# Patient Record
Sex: Male | Born: 1985 | Race: Black or African American | Hispanic: No | Marital: Single
Health system: Southern US, Community
[De-identification: ages and names within clinical notes are randomized; demographics above are authoritative.]

---
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
## 2016-12-31 ENCOUNTER — Encounter: Payer: Self-pay | Admitting: Emergency Medicine

## 2016-12-31 ENCOUNTER — Emergency Department
Admission: EM | Admit: 2016-12-31 | Discharge: 2016-12-31 | Disposition: A | Discharge status: Home / Self Care | Payer: No Typology Code available for payment source | Attending: Emergency Medicine | Admitting: Emergency Medicine

## 2016-12-31 DIAGNOSIS — M791 Myalgia: Secondary | ICD-10-CM | POA: Diagnosis not present

## 2016-12-31 DIAGNOSIS — M25511 Pain in right shoulder: Secondary | ICD-10-CM | POA: Diagnosis present

## 2016-12-31 DIAGNOSIS — M7918 Myalgia, other site: Secondary | ICD-10-CM

## 2016-12-31 MED ORDER — IBUPROFEN 800 MG PO TABS: 800.0000 mg | ORAL_TABLET | Freq: Three times a day (TID) | ORAL | 0 refills | Status: AC | PRN

## 2016-12-31 MED ORDER — METHOCARBAMOL 750 MG PO TABS: 750.0000 mg | ORAL_TABLET | Freq: Four times a day (QID) | ORAL | 0 refills | Status: AC
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
## 2016-12-31 NOTE — Discharge Instructions (Signed)
Take ibuprofen and robaxin, use wet heat, stretches, then ice, follow-up with [facility], if worsening return to the ED
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
## 2017-01-07 ENCOUNTER — Ambulatory Visit
Admission: RE | Admit: 2017-01-07 | Discharge: 2017-01-07 | Disposition: A | Discharge status: Home / Self Care | Payer: No Typology Code available for payment source | Source: Ambulatory Visit | Attending: Chiropractor | Admitting: Chiropractor

## 2017-01-07 ENCOUNTER — Other Ambulatory Visit: Payer: Self-pay | Admitting: Chiropractor

## 2017-01-07 DIAGNOSIS — R52 Pain, unspecified: Secondary | ICD-10-CM | POA: Insufficient documentation

## 2020-08-20 ENCOUNTER — Emergency Department: Payer: No Typology Code available for payment source

## 2020-08-20 ENCOUNTER — Other Ambulatory Visit: Payer: Self-pay

## 2020-08-20 ENCOUNTER — Encounter: Payer: Self-pay | Admitting: Emergency Medicine

## 2020-08-20 ENCOUNTER — Emergency Department
Admission: EM | Admit: 2020-08-20 | Discharge: 2020-08-21 | Disposition: A | Discharge status: Home / Self Care | Payer: No Typology Code available for payment source | Attending: Student in an Organized Health Care Education/Training Program | Admitting: Student in an Organized Health Care Education/Training Program

## 2020-08-20 DIAGNOSIS — R0981 Nasal congestion: Secondary | ICD-10-CM | POA: Diagnosis present

## 2020-08-20 DIAGNOSIS — R002 Palpitations: Secondary | ICD-10-CM | POA: Diagnosis not present

## 2020-08-20 DIAGNOSIS — R519 Headache, unspecified: Secondary | ICD-10-CM | POA: Diagnosis not present

## 2020-08-20 NOTE — ED Triage Notes (Signed)
First Nurse Note:  C/O month long history of intermittent headaches.  Patient states 'I feel so bad".  AAOx3.  Skin warm and dry.  MAE equally and strong.  Ambulates with easy and steady gait. NAD

## 2020-08-20 NOTE — Discharge Instructions (Addendum)
Purchase Flonase over the counter at [employer].  Use in both nares daily.  Follow up with PCP.  Return for fever, numbness, tingling, or any additional questions or concerns.

## 2020-08-20 NOTE — ED Provider Notes (Signed)
[facility] Emergency Department Provider Note    Event Date/Time   First MD Initiated Contact with Patient 08/20/20 2133     (approximate)  I have reviewed the triage vital signs and the nursing notes.   HISTORY  Chief Complaint Headache    HPI Donald Harmon is a 35 y.o. male Modena Jansky the ER for evaluation of more than 1 month of intermittent chronic daily headaches.  Also feel like he is intermittently having some palpitations but none today.  Denies any chest pain or shortness of breath this time.  Has been having some nasal congestion primarily on the left side.  No numbness tingling.  No visual disturbance.  Never had symptoms like this before.  Was sick with COVID beginning of the year.  Denies any lower extremity swelling.  No trauma.  Does smoke.    History reviewed. No pertinent past medical history. No family history on file. History reviewed. No pertinent surgical history. There are no problems to display for this patient.     Prior to Admission medications   Medication Sig Start Date End Date Taking? Authorizing Provider  ibuprofen (ADVIL,MOTRIN) 800 MG tablet Take 1 tablet (800 mg total) by mouth every 8 (eight) hours as needed. 12/31/16   Fisher, Roselyn Bering, PA-C  methocarbamol (ROBAXIN-750) 750 MG tablet Take 1 tablet (750 mg total) by mouth 4 (four) times daily. 12/31/16   Faythe Ghee, PA-C    Allergies Shellfish allergy    Social History    Review of Systems Patient denies headaches, rhinorrhea, blurry vision, numbness, shortness of breath, chest pain, edema, cough, abdominal pain, nausea, vomiting, diarrhea, dysuria, fevers, rashes or hallucinations unless otherwise stated above in HPI. ____________________________________________   PHYSICAL EXAM:  VITAL SIGNS: Vitals:   08/20/20 1804  BP: 114/74  Pulse: (!) 57  Resp: 16  Temp: 98.3 F (36.8 C)  SpO2: 98%    Constitutional: Alert and oriented.  Eyes: Conjunctivae are  normal.  Head: Atraumatic. Nose: + left nasal congestion Mouth/Throat: Mucous membranes are moist.   Neck: No stridor. Painless ROM.  Cardiovascular: Normal rate, regular rhythm. Grossly normal heart sounds.  Good peripheral circulation. Respiratory: Normal respiratory effort.  No retractions. Lungs CTAB. Gastrointestinal: Soft and nontender. No distention. No abdominal bruits. No CVA tenderness. Genitourinary: deferred Musculoskeletal: No lower extremity tenderness nor edema.  No joint effusions. Neurologic:  CN- intact.  No facial droop, Normal FNF.  Normal heel to shin.  Sensation intact bilaterally. Normal speech and language. No gross focal neurologic deficits are appreciated. No gait instability. Skin:  Skin is warm, dry and intact. No rash noted. Psychiatric: Mood and affect are normal. Speech and behavior are normal.  ____________________________________________   LABS (all labs ordered are listed, but only abnormal results are displayed)  No results found for this or any previous visit (from the past 24 hour(s)). ____________________________________________  EKG My review and personal interpretation at Time: 22:48   Indication: palpitations  Rate: 50  Rhythm: sinus Axis: normal Other: BER, no reciprocal depressions, normal intervals ____________________________________________  RADIOLOGY  I personally reviewed all radiographic images ordered to evaluate for the above acute complaints and reviewed radiology reports and findings.  These findings were personally discussed with the patient.  Please see medical record for radiology report.  ____________________________________________   PROCEDURES  Procedure(s) performed:  Procedures    Critical Care performed: no ____________________________________________   INITIAL IMPRESSION / ASSESSMENT AND PLAN / ED COURSE  Pertinent labs & imaging results  that were available during my care of the patient were reviewed by me  and considered in my medical decision making (see chart for details).   DDX: Congestion, URI, sinusitis, mass, IPH, allergies   Donald Harmon is a 35 y.o. who presents to the ED with presentation as described above.  Patient clinically well-appearing in no acute distress.  Exam is reassuring.  Given duration of symptoms patient requesting CT imaging which was reasonable as no history of headaches.  Not consistent with SAH.  No signs of meningitis.  Imaging is reassuring.  Denying any chest pain or pressure no shortness of breath.  No wheezing on exam.  No fever doubt bacterial infection.  Does appear clinically appropriate for outpatient follow-up     The patient was evaluated in Emergency Department today for the symptoms described in the history of present illness. He/she was evaluated in the context of the global COVID-19 pandemic, which necessitated consideration that the patient might be at risk for infection with the SARS-CoV-2 virus that causes COVID-19. Institutional protocols and algorithms that pertain to the evaluation of patients at risk for COVID-19 are in a state of rapid change based on information released by regulatory bodies including the CDC and federal and state organizations. These policies and algorithms were followed during the patient's care in the ED.  As part of my medical decision making, I reviewed the following data within the electronic MEDICAL RECORD NUMBER Nursing notes reviewed and incorporated, Labs reviewed, notes from prior ED visits and [place] Controlled Substance Database   ____________________________________________   FINAL CLINICAL IMPRESSION(S) / ED DIAGNOSES  Final diagnoses:  Congestion of nasal sinus      NEW MEDICATIONS STARTED DURING THIS VISIT:  New Prescriptions   No medications on file     Note:  This document was prepared using Dragon voice recognition software and may include unintentional dictation errors.    Willy Eddy, MD 08/20/20  [phone]

## 2020-11-13 ENCOUNTER — Emergency Department: Payer: No Typology Code available for payment source

## 2020-11-13 ENCOUNTER — Other Ambulatory Visit: Payer: Self-pay

## 2020-11-13 ENCOUNTER — Emergency Department
Admission: EM | Admit: 2020-11-13 | Discharge: 2020-11-13 | Disposition: A | Discharge status: Home / Self Care | Payer: No Typology Code available for payment source | Attending: Emergency Medicine | Admitting: Emergency Medicine

## 2020-11-13 DIAGNOSIS — R0602 Shortness of breath: Secondary | ICD-10-CM | POA: Insufficient documentation

## 2020-11-13 DIAGNOSIS — G44011 Episodic cluster headache, intractable: Secondary | ICD-10-CM

## 2020-11-13 DIAGNOSIS — G44019 Episodic cluster headache, not intractable: Secondary | ICD-10-CM | POA: Insufficient documentation

## 2020-11-13 DIAGNOSIS — R002 Palpitations: Secondary | ICD-10-CM | POA: Diagnosis not present

## 2020-11-13 LAB — BASIC METABOLIC PANEL
Anion gap: 8 (ref 5–15)
BUN: 12 mg/dL (ref 6–20)
CO2: 28 mmol/L (ref 22–32)
Calcium: 9.4 mg/dL (ref 8.9–10.3)
Chloride: 104 mmol/L (ref 98–111)
Creatinine, Ser: 0.97 mg/dL (ref 0.61–1.24)
GFR, Estimated: >60 mL/min (ref >60)
Glucose, Bld: 108 mg/dL — ABNORMAL HIGH (ref 70–99)
Potassium: 3.3 mmol/L — ABNORMAL LOW (ref 3.5–5.1)
Sodium: 140 mmol/L (ref 135–145)

## 2020-11-13 LAB — CBC
HCT: 39.4 % (ref 39.0–52.0)
Hemoglobin: 14.7 g/dL (ref 13.0–17.0)
MCH: 28.7 pg (ref 26.0–34.0)
MCHC: 37.3 g/dL — ABNORMAL HIGH (ref 30.0–36.0)
MCV: 76.8 fL — ABNORMAL LOW (ref 80.0–100.0)
Platelets: 235 10*3/uL (ref 150–400)
RBC: 5.13 MIL/uL (ref 4.22–5.81)
RDW: 12.7 % (ref 11.5–15.5)
WBC: 8.7 10*3/uL (ref 4.0–10.5)
nRBC: 0 % (ref 0.0–0.2)

## 2020-11-13 LAB — TROPONIN I (HIGH SENSITIVITY): Troponin I (High Sensitivity): <2 ng/L (ref <18)

## 2020-11-13 MED ORDER — MAGNESIUM OXIDE -MG SUPPLEMENT 400 (240 MG) MG PO TABS
400.0000 mg | ORAL_TABLET | Freq: Once | ORAL | Status: AC
  Administered 2020-11-13: 400 mg via ORAL
  Filled 2020-11-13: qty 1

## 2020-11-13 MED ORDER — POTASSIUM CHLORIDE CRYS ER 20 MEQ PO TBCR
40.0000 meq | EXTENDED_RELEASE_TABLET | Freq: Once | ORAL | Status: AC
  Administered 2020-11-13: 40 meq via ORAL
  Filled 2020-11-13: qty 2

## 2020-11-13 NOTE — Discharge Instructions (Addendum)
Please schedule a follow-up appointment with neurology as above.  Please try to keep a headache journal to try to identify any possible triggers.  Return to the emergency department if you experience any worsening or changes in your symptoms, otherwise follow-up as above.

## 2020-11-13 NOTE — ED Provider Notes (Signed)
[facility] Emergency Department Provider Note  ____________________________________________   Event Date/Time   First MD Initiated Contact with Patient 11/13/20 1703     (approximate)  I have reviewed the triage vital signs and the nursing notes.   HISTORY  Chief Complaint Palpitations  HPI Donald Harmon is a 35 y.o. male who reports to the emergency department for evaluation of "a migraine since January".  Patient states that he has a headache nearly every day located on the left front of his head, just behind his left eye.  He reports it feels like a stabbing pain that lasts anywhere from 30 minutes to an hour and then spontaneously resolves.  He states that these occur roughly the same time every day, in the early afternoon.  When he feels these headaches, he also develops a sensation of palpitations in his chest with shortness of breath and occasionally feeling dizzy with this.  He states that at this time, due to the wait time in the waiting room, his symptoms have fully resolved.  However, he states that these headaches are affecting his life so much is that he had to quit his job a few months ago due to the severity.  He has tried over-the-counter Tylenol and ibuprofen with no relief in his symptoms.  He denies any trauma.         History reviewed. No pertinent past medical history.  There are no problems to display for this patient.   No past surgical history on file.  Prior to Admission medications   Medication Sig Start Date End Date Taking? Authorizing Provider  ibuprofen (ADVIL,MOTRIN) 800 MG tablet Take 1 tablet (800 mg total) by mouth every 8 (eight) hours as needed. 12/31/16   Fisher, Roselyn Bering, PA-C  methocarbamol (ROBAXIN-750) 750 MG tablet Take 1 tablet (750 mg total) by mouth 4 (four) times daily. 12/31/16   Faythe Ghee, PA-C    Allergies Shellfish allergy  No family history on file.  Social History Social History   Substance  Use Topics   Alcohol use: Yes   Drug use: Yes    Types: Marijuana    Review of Systems Constitutional: No fever/chills Eyes: No visual changes. ENT: No sore throat. Cardiovascular: + chest pain, palpitations Respiratory: + shortness of breath. Gastrointestinal: No abdominal pain.  No nausea, no vomiting.  No diarrhea.  No constipation. Genitourinary: Negative for dysuria. Musculoskeletal: Negative for back pain. Skin: Negative for rash. Neurological: + headaches, negative for focal weakness or numbness.  ____________________________________________   PHYSICAL EXAM:  VITAL SIGNS: ED Triage Vitals [11/13/20 1451]  Enc Vitals Group     BP (!) 140/96     Pulse Rate 73     Resp 16     Temp 98.1 F (36.7 C)     Temp Source Oral     SpO2 98 %     Weight 172 lb (78 kg)     Height 6\' 1"  (1.854 m)     Head Circumference      Peak Flow      Pain Score 3     Pain Loc      Pain Edu?      Excl. in GC?    Constitutional: Alert and oriented. Well appearing and in no acute distress. Eyes: Conjunctivae are normal. PERRL. EOMI. Head: Atraumatic. Nose: No congestion/rhinnorhea. Mouth/Throat: Mucous membranes are moist.  Oropharynx non-erythematous. Neck: No stridor.  No tenderness to palpation, no meningeal signs. Cardiovascular: Normal rate,  regular rhythm. Grossly normal heart sounds.  Good peripheral circulation. Respiratory: Normal respiratory effort.  No retractions. Lungs CTAB. Gastrointestinal: Soft and nontender. No distention. No abdominal bruits. No CVA tenderness. Musculoskeletal: No lower extremity tenderness nor edema.  No joint effusions. Neurologic:  Normal speech and language.  Cranial nerves II through XII grossly intact.  No pronator drift.  5/5 strength in the bilateral upper and lower extremities.  No gross focal neurologic deficits are appreciated. No gait instability. Skin:  Skin is warm, dry and intact. No rash noted. Psychiatric: Mood and affect are normal.  Speech and behavior are normal.  ____________________________________________   LABS (all labs ordered are listed, but only abnormal results are displayed)  Labs Reviewed  BASIC METABOLIC PANEL - Abnormal; Notable for the following components:      Result Value   Potassium 3.3 (*)    Glucose, Bld 108 (*)    All other components within normal limits  CBC - Abnormal; Notable for the following components:   MCV 76.8 (*)    MCHC 37.3 (*)    All other components within normal limits  TROPONIN I (HIGH SENSITIVITY)  TROPONIN I (HIGH SENSITIVITY)   ____________________________________________  EKG  Normal sinus rhythm with a rate of 91 bpm.  No significant ST elevations or depressions to suggest acute ischemia. ____________________________________________  RADIOLOGY I, Lucy Chris, personally viewed and evaluated these images (plain radiographs) as part of my medical decision making, as well as reviewing the written report by the radiologist.  ED provider interpretation: No acute cardiopulmonary disease noted on chest x-ray  Official radiology report(s): DG Chest 2 View  Result Date: 11/13/2020 CLINICAL DATA:  Chest pain, headache since January for shortness of breath and palpitations. EXAM: CHEST - 2 VIEW COMPARISON:  November 13, 2020. FINDINGS: Trachea is midline. Cardiomediastinal contours and hilar structures are normal. Lungs are clear.  No sign of pleural effusion.  No On limited assessment no acute skeletal process. IMPRESSION: No acute cardiopulmonary disease. Electronically Signed   By: Donzetta Kohut M.D.   On: 11/13/2020 15:29     ____________________________________________   INITIAL IMPRESSION / ASSESSMENT AND PLAN / ED COURSE  As part of my medical decision making, I reviewed the following data within the electronic MEDICAL RECORD NUMBER Nursing notes reviewed and incorporated, Labs reviewed, Old chart reviewed, Radiograph reviewed, and Notes from prior ED visits         Patient is a 35 year old male who presents to the emergency department for evaluation of left-sided severe headache that has been occurring daily in the early afternoons since January.  He reports associated chest pain with palpitations and shortness of breath during the headache but states that the symptoms spontaneously resolved with the resolution of the headache.  See HPI for further details.  In triage, patient is mildly hypertensive but otherwise has normal vital signs.  Physical exam as above.  Notably, the patient does not have any meningeal signs.  There is no noted fever, no rash or recent insect bites.  He is neurologically intact with no acute deficits appreciated.  Labs were reviewed including CBC, BMP, initial troponin.  CBC grossly within normal limits, BMP with a very mild hypokalemia of 3.3, otherwise grossly normal.  Troponin is less than 2.  EKG reviewed demonstrating normal sinus rhythm with no evidence of tachycardia, arrhythmias or other changes.  Chest x-ray reviewed without any acute abnormalities.  The patient's previous chart was reviewed including an ER visit from May of this year  at which time a CT scan was performed given his symptoms and was negative for acute abnormality.  Patient's symptoms appear to be most consistent with a cluster headache inducing palpitations and possible anxiety as the patient does report that he feels anxious when he has these severe headaches.  Low suspicion for ACS given above.  Case was also discussed with attending Dr. Katrinka Blazing.  We will give single oral dose of potassium and magnesium for mild depletion.  Recommended that the patient follow-up closely with outpatient neurology for evaluation of his headaches.  Patient is amenable with this plan, return precautions were discussed and he stable at this time for outpatient follow-up.      ____________________________________________   FINAL CLINICAL IMPRESSION(S) / ED DIAGNOSES  Final  diagnoses:  Intractable episodic cluster headache  Palpitations     ED Discharge Orders     None        Note:  This document was prepared using Dragon voice recognition software and may include unintentional dictation errors.    Lucy Chris, PA 11/13/20 Aldona Lento    [place]ton [place], MD 11/13/20 [phone]

## 2020-11-13 NOTE — ED Triage Notes (Signed)
Pt to ED POV reports headache since January with shob and palpitations/chest discomfort  Reports being seen in ED once for the same and was told to use OTC pain relievers.  Pt in NAD, ambulatory.  Denies recent falls or injuries.

## 2022-11-18 IMAGING — CT CT HEAD W/O CM
4 series · 17 of 47 positions shown, 19 images · non-contrast
Comparison: None.

CLINICAL DATA: Intermittent headaches.

EXAM:
CT HEAD WITHOUT CONTRAST
TECHNIQUE: Contiguous axial images were obtained from the base of the skull
through the vertex without intravenous contrast.

[Series 2: head bone · axial · 0.48mm/px · z∈[-164,-110]mm · 4 of 78 slices shown]
[im 8/78  bone]
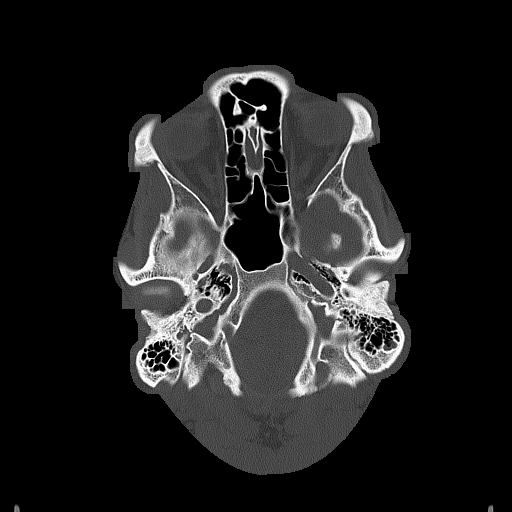
[im 16/78  bone]
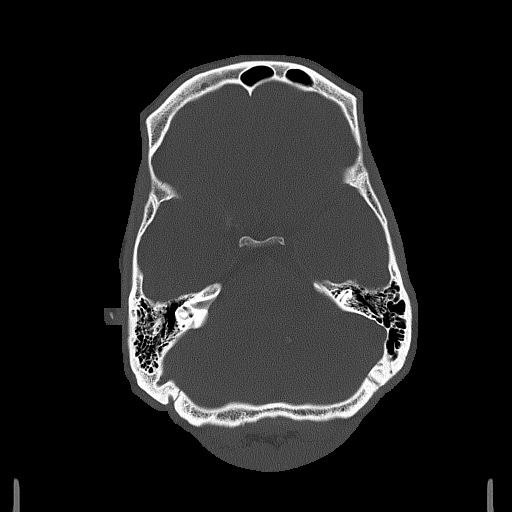
[im 24/78  bone]
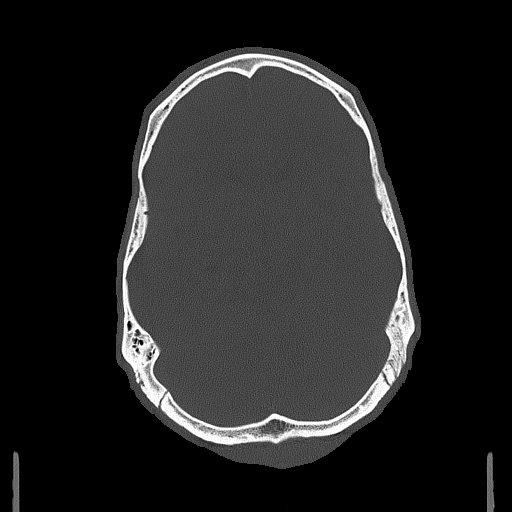
[im 35/78  bone]
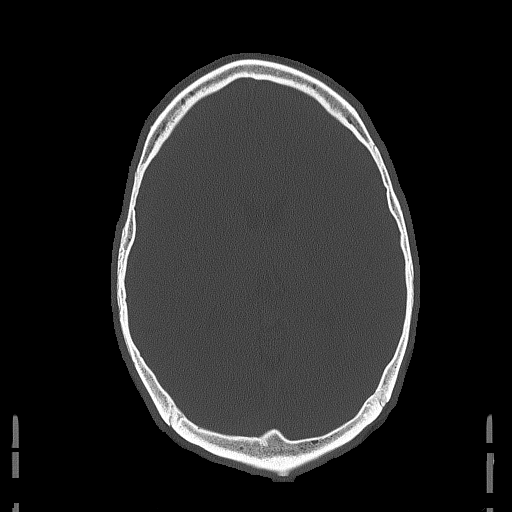

[Series 3: head wo · axial · 0.48mm/px · z∈[-165,-45]mm · 7 of 32 slices shown, 9 images]
[im 4/32  brain]
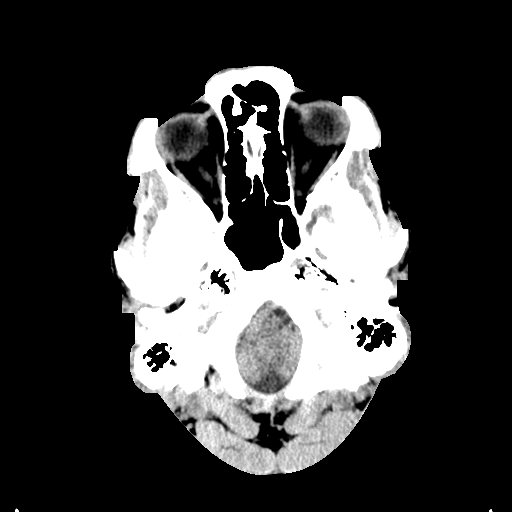
[im 4/32  bone]
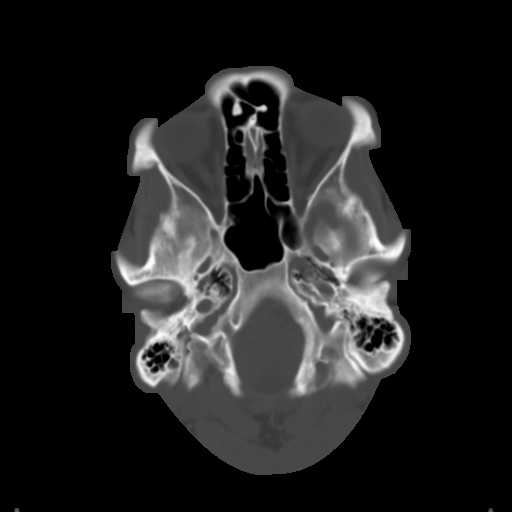
[im 8/32  brain]
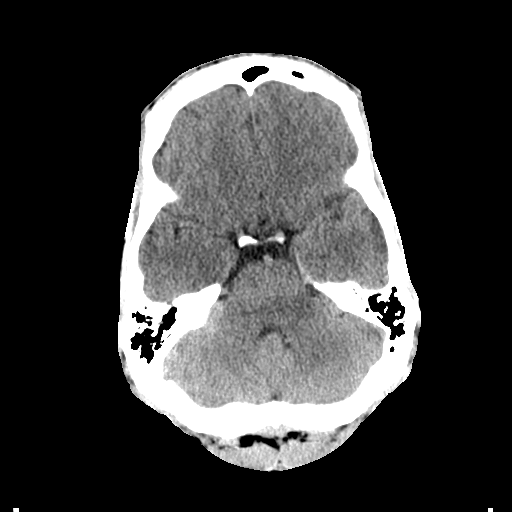
[im 12/32  brain]
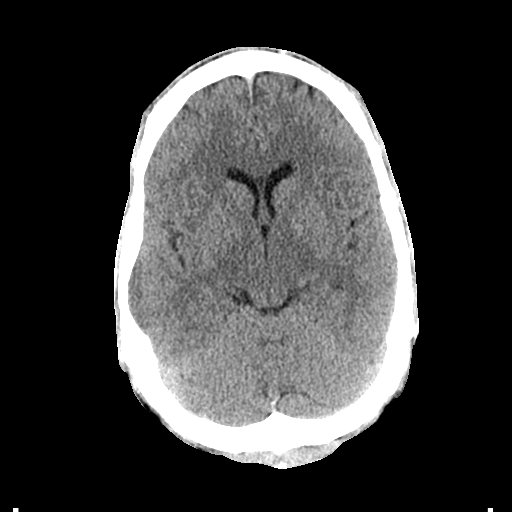
[im 16/32  brain]
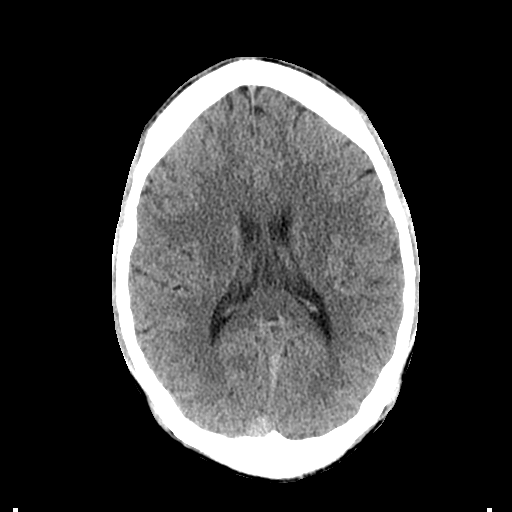
[im 20/32  brain]
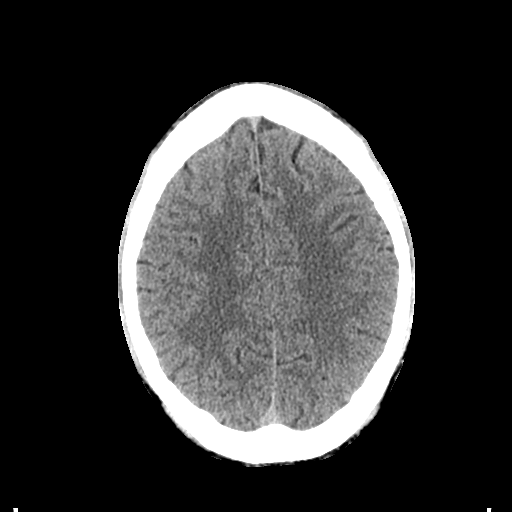
[im 20/32  bone]
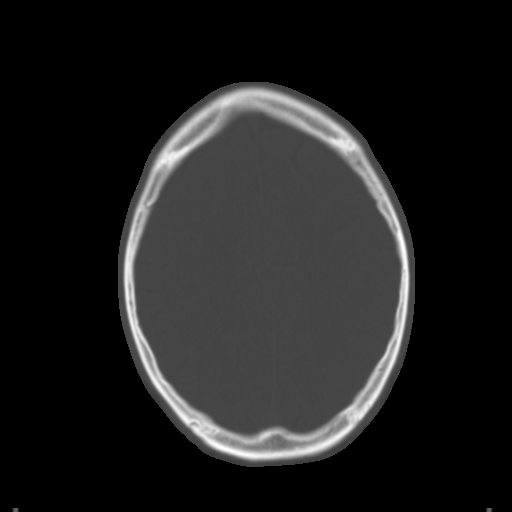
[im 24/32  brain]
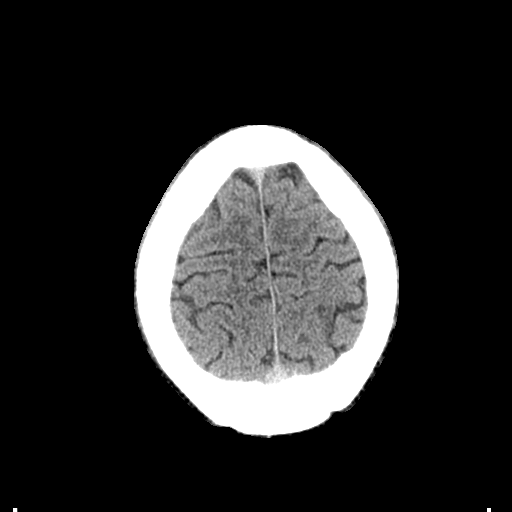
[im 28/32  brain]
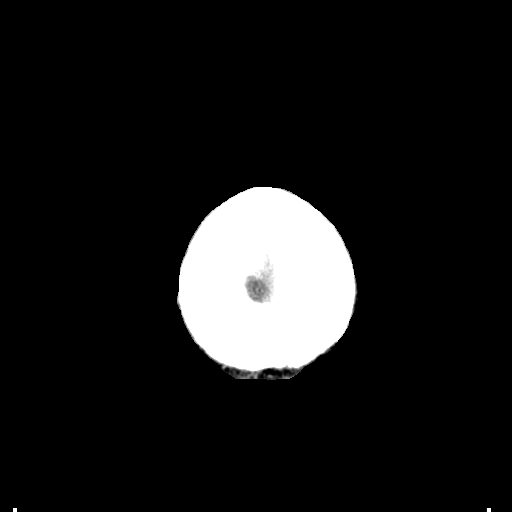

[Series 4: coronal soft tissue · coronal · 0.33mm/px · 3 of 73 slices shown]
[im 25/73  brain]
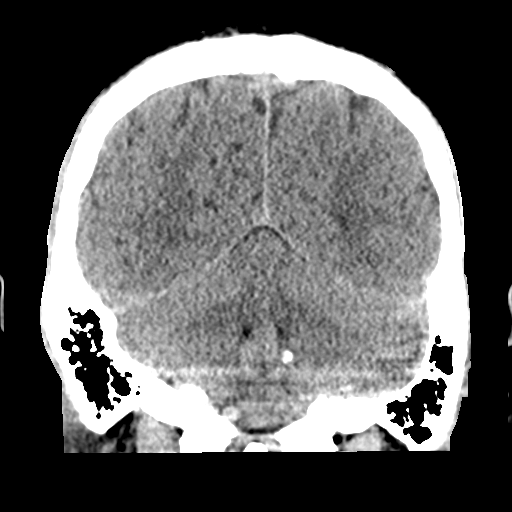
[im 33/73  brain]
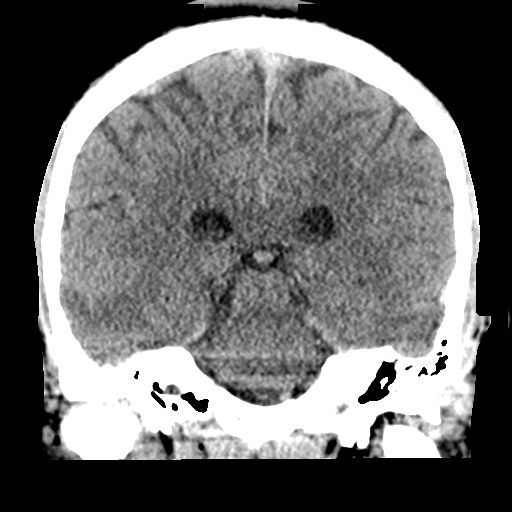
[im 41/73  brain]
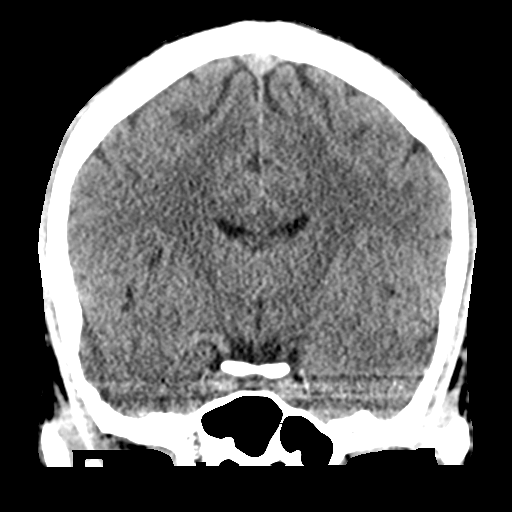

[Series 5: sagittal soft tissue · sagittal · 0.35mm/px · 3 of 56 slices shown]
[im 19/56  brain]
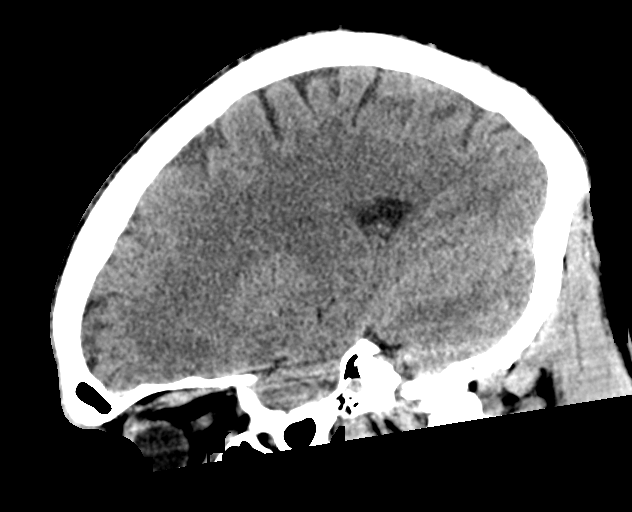
[im 28/56  brain]
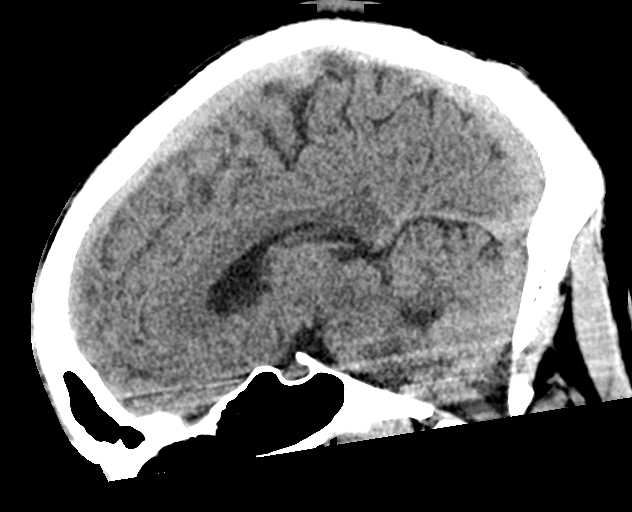
[im 37/56  brain]
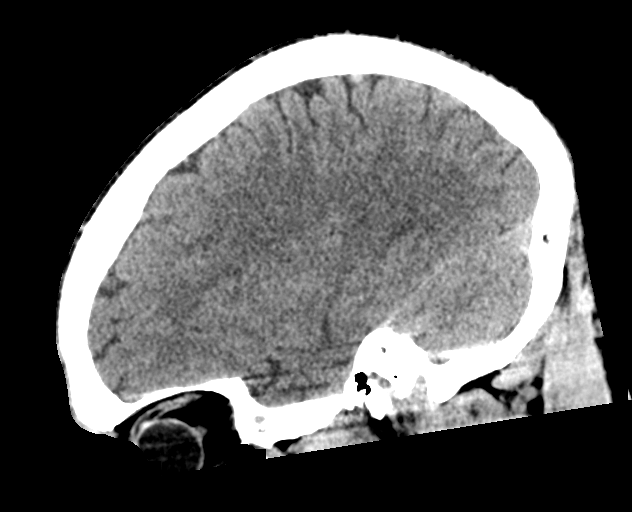

[17 of 47 positions shown; findings below may reference images not displayed]

FINDINGS: Brain: No evidence of acute infarction, hemorrhage, hydrocephalus,
extra-axial collection or mass lesion/mass effect.

Vascular: No hyperdense vessel or unexpected calcification.

Skull: Normal. Negative for fracture or focal lesion.

Sinuses/Orbits: No acute finding.

Other: None.
IMPRESSION: No acute intracranial abnormality.

## 2023-02-11 IMAGING — CR DG CHEST 2V
1 series · 2 of 2 positions shown · non-contrast
Comparison: November 13, 2020.

CLINICAL DATA: Chest pain, headache since [REDACTED] for shortness of
breath and palpitations.

EXAM:
CHEST - 2 VIEW

[Series 1: w chest pa · 0.14mm/px · 2 of 2 slices shown]
[im 1/2]
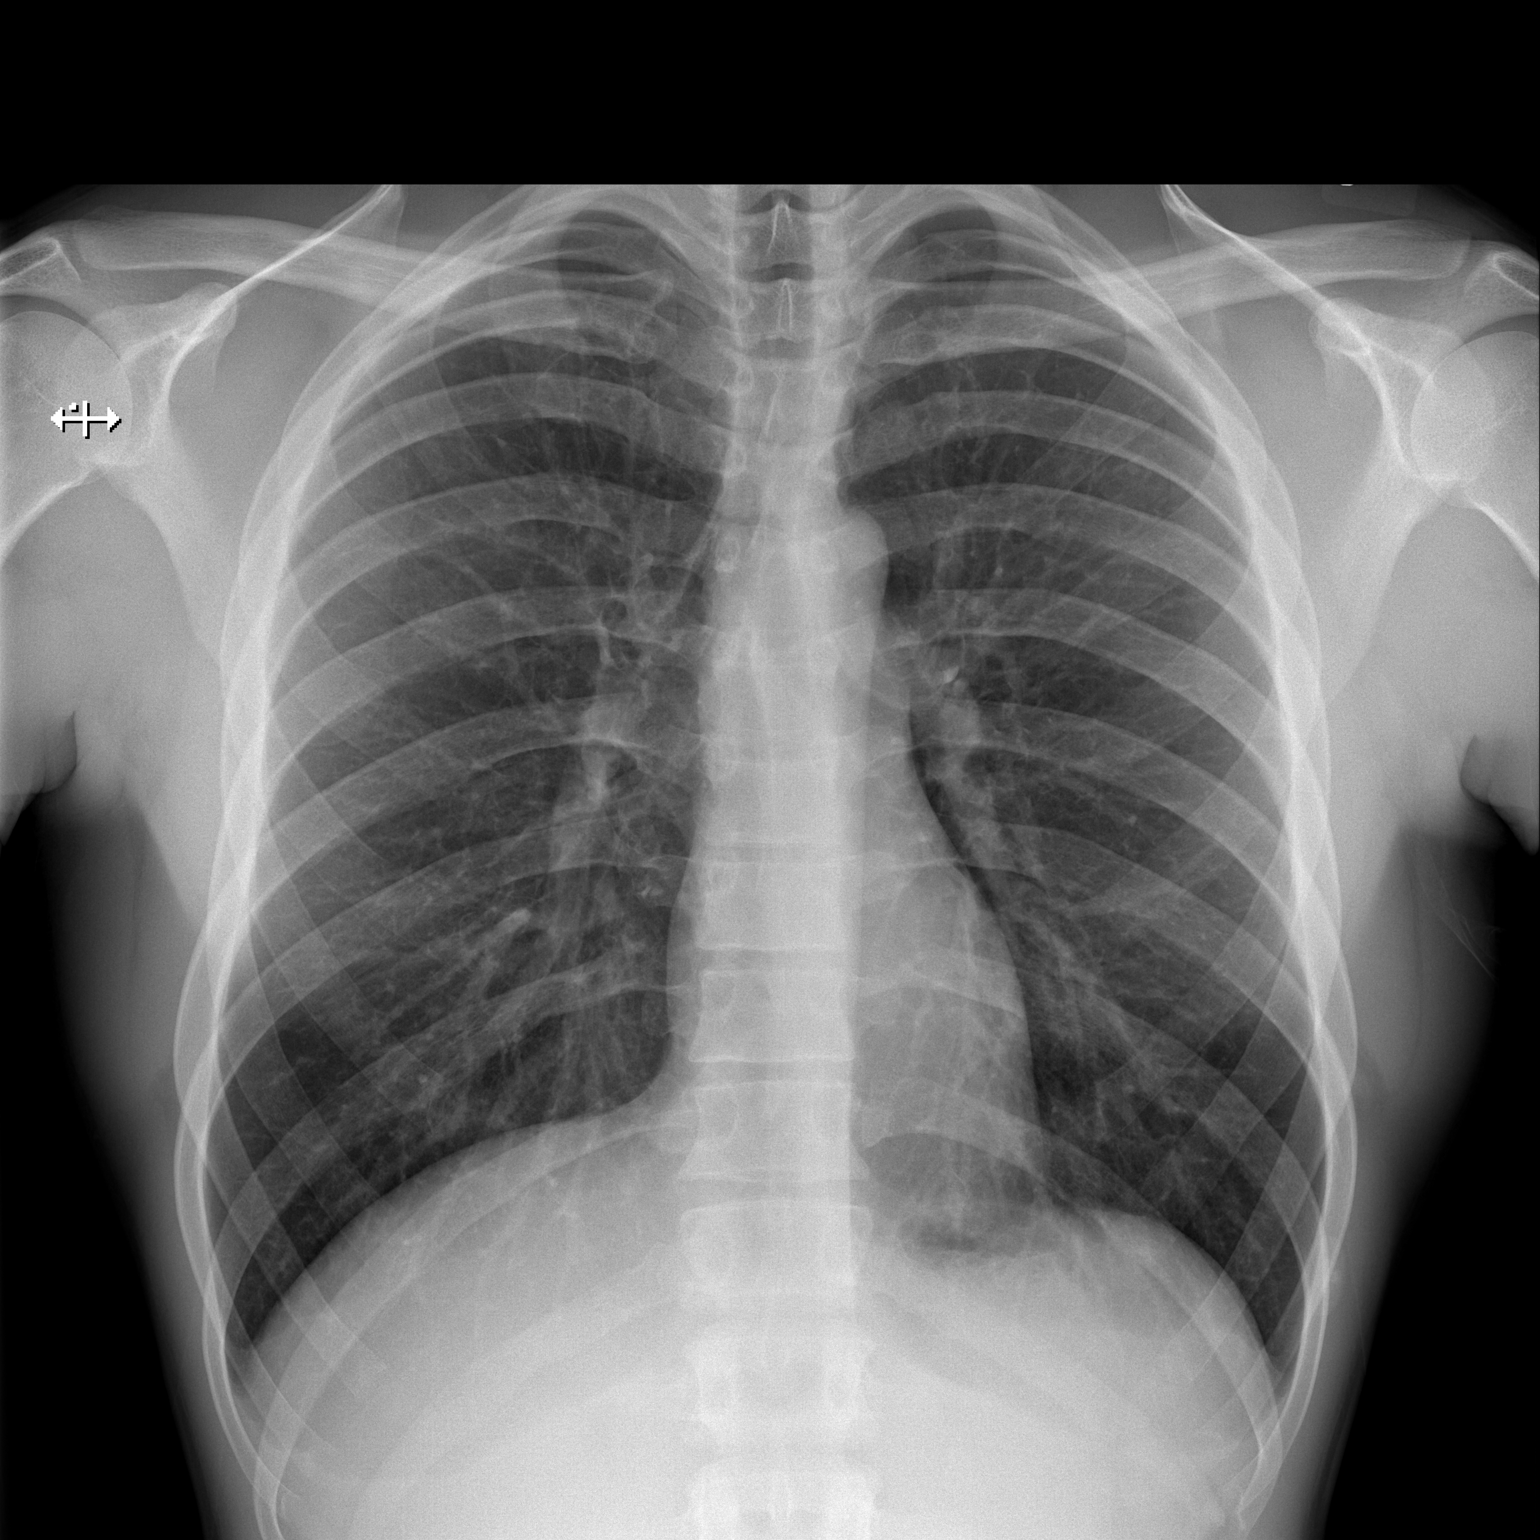
[im 2/2]
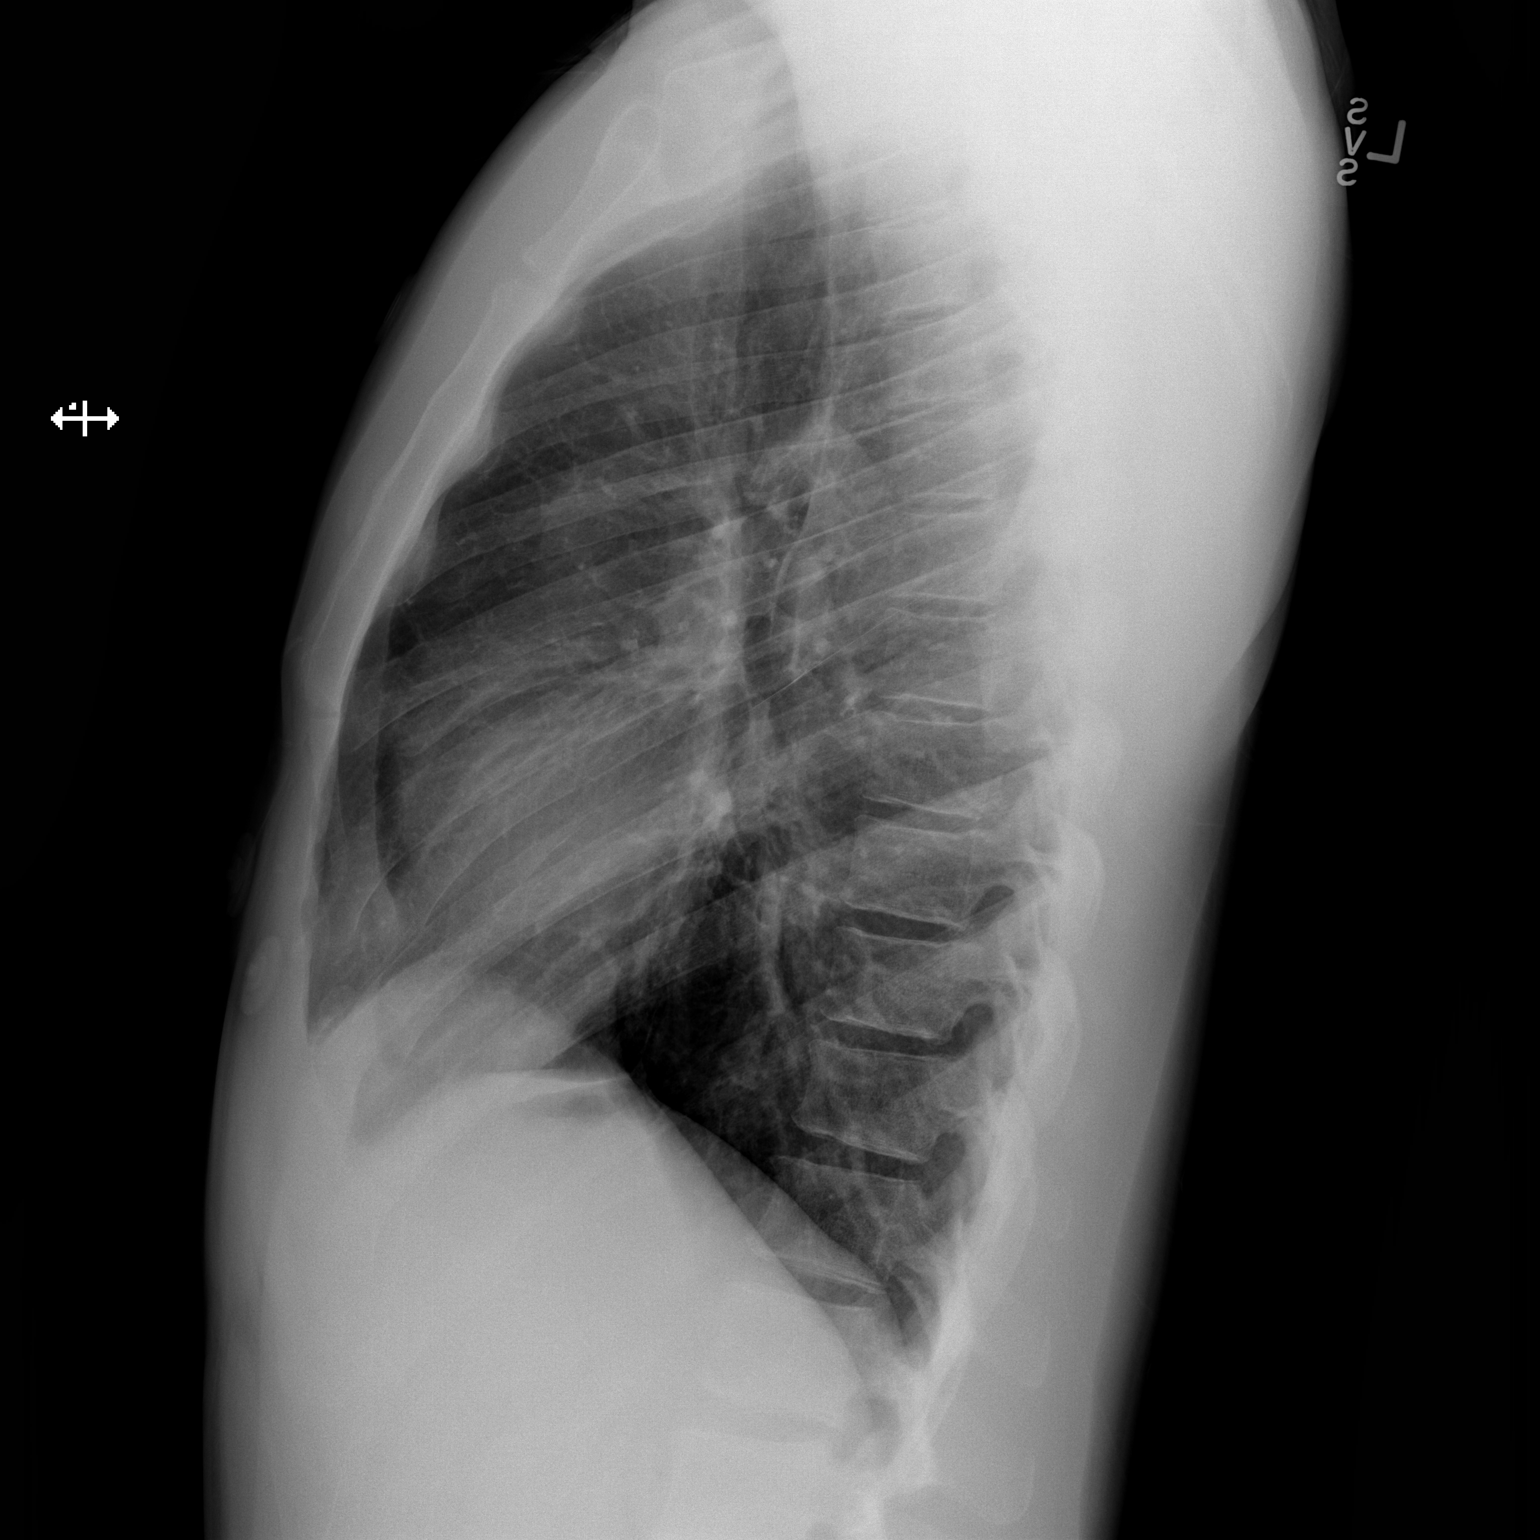

[2 of 2 positions shown; findings below may reference images not displayed]

FINDINGS: Trachea is midline. Cardiomediastinal contours and hilar structures
are normal.

Lungs are clear.  No sign of pleural effusion.  No

On limited assessment no acute skeletal process.
IMPRESSION: No acute cardiopulmonary disease.
